# Patient Record
Sex: Female | Born: 1935 | Hispanic: No | Marital: Single | Smoking: Never smoker
Health system: Southern US, Community
[De-identification: ages and names within clinical notes are randomized; demographics above are authoritative.]

## PROBLEM LIST (undated history)

## (undated) DIAGNOSIS — J45909 Unspecified asthma, uncomplicated: Secondary | ICD-10-CM

## (undated) DIAGNOSIS — I10 Essential (primary) hypertension: Secondary | ICD-10-CM

## (undated) DIAGNOSIS — G43909 Migraine, unspecified, not intractable, without status migrainosus: Secondary | ICD-10-CM

## (undated) DIAGNOSIS — E119 Type 2 diabetes mellitus without complications: Secondary | ICD-10-CM

---

## 2014-03-05 ENCOUNTER — Emergency Department (HOSPITAL_COMMUNITY)
Admission: EM | Admit: 2014-03-05 | Discharge: 2014-03-06 | Disposition: A | Discharge status: Home / Self Care | Payer: Self-pay | Attending: Emergency Medicine | Admitting: Emergency Medicine

## 2014-03-05 ENCOUNTER — Encounter (HOSPITAL_COMMUNITY): Payer: Self-pay | Admitting: Emergency Medicine

## 2014-03-05 DIAGNOSIS — G43909 Migraine, unspecified, not intractable, without status migrainosus: Secondary | ICD-10-CM | POA: Insufficient documentation

## 2014-03-05 DIAGNOSIS — J45909 Unspecified asthma, uncomplicated: Secondary | ICD-10-CM | POA: Insufficient documentation

## 2014-03-05 DIAGNOSIS — I10 Essential (primary) hypertension: Secondary | ICD-10-CM | POA: Insufficient documentation

## 2014-03-05 DIAGNOSIS — E119 Type 2 diabetes mellitus without complications: Secondary | ICD-10-CM | POA: Insufficient documentation

## 2014-03-05 HISTORY — DX: Type 2 diabetes mellitus without complications: E11.9

## 2014-03-05 HISTORY — DX: Migraine, unspecified, not intractable, without status migrainosus: G43.909

## 2014-03-05 HISTORY — DX: Unspecified asthma, uncomplicated: J45.909

## 2014-03-05 HISTORY — DX: Essential (primary) hypertension: I10

## 2014-03-05 LAB — I-STAT TROPONIN, ED: Troponin i, poc: 0.01 ng/mL (ref 0.00–0.08)

## 2014-03-05 LAB — I-STAT CHEM 8, ED
BUN: 17 mg/dL (ref 6–23)
Calcium, Ion: 1.24 mmol/L (ref 1.13–1.30)
Chloride: 102 meq/L (ref 96–112)
Creatinine, Ser: 0.9 mg/dL (ref 0.50–1.10)
Glucose, Bld: 174 mg/dL — ABNORMAL HIGH (ref 70–99)
HCT: 40 % (ref 36.0–46.0)
Hemoglobin: 13.6 g/dL (ref 12.0–15.0)
Potassium: 4.1 meq/L (ref 3.7–5.3)
Sodium: 139 meq/L (ref 137–147)
TCO2: 27 mmol/L (ref 0–100)

## 2014-03-05 MED ORDER — HYDROMORPHONE HCL PF 1 MG/ML IJ SOLN
0.5000 mg | Freq: Once | INTRAMUSCULAR | Status: AC
  Administered 2014-03-05: 0.5 mg via INTRAVENOUS
  Filled 2014-03-05: qty 1

## 2014-03-05 MED ORDER — METOCLOPRAMIDE HCL 5 MG/ML IJ SOLN
5.0000 mg | Freq: Once | INTRAMUSCULAR | Status: AC
  Administered 2014-03-05: 5 mg via INTRAVENOUS
  Filled 2014-03-05: qty 2

## 2014-03-05 NOTE — ED Notes (Signed)
Pt is c/o migraine headache  Pt states she has pain in her head every night and has for years but tonight the pain is more severe than normal and it is radiating down the left side into her arm and ribs  Pt states she took her medication for her migraines but it is not working

## 2014-03-05 NOTE — ED Notes (Signed)
Pt is alert and oriented, though it is hard to obtain a direct answer is to why tonight is different this pain has been going on for two years,  The reason I understand tonight is different is because she was crying and her usual dose of migraine medication isn't helping.

## 2014-03-05 NOTE — ED Provider Notes (Addendum)
CSN: [id number]     Arrival date & time 03/05/14  2017 History   First MD Initiated Contact with Patient 03/05/14 2113     Chief Complaint  Patient presents with  . Migraine     (Consider location/radiation/quality/duration/timing/severity/associated sxs/prior Treatment) HPI Complains of left-sidedt frontal temporal parietal headache radiating to her left posterior ribs and left arm typical of her migraines that she's had in the past first several years. Symptoms started yesterday. No treatment prior to coming here nothing makes symptoms better or worse she gets similar headaches several times per month for many years. No other associated symptoms. No associated shortness of breath nausea or sweating. Past Medical History  Diagnosis Date  . Migraine headache   . Diabetes mellitus without complication   . Hypertension   . Asthma    History reviewed. No pertinent past surgical history. Family History  Problem Relation Age of Onset  . Diabetes Other    History  Substance Use Topics  . Smoking status: Never Smoker   . Smokeless tobacco: Not on file  . Alcohol Use: No   [place]   Grav Para Term Preterm Abortions TAB SAB Ect Mult Living                 Review of Systems  Constitutional: Negative.   Respiratory: Negative.   Cardiovascular: Negative.   Gastrointestinal: Negative.   Musculoskeletal: Negative.   Skin: Negative.   Neurological: Positive for headaches.  Psychiatric/Behavioral: Negative.   All other systems reviewed and are negative.     Allergies  Review of patient's allergies indicates no known allergies.  Home Medications   Prior to Admission medications   Medication Sig Start Date End Date Taking? Authorizing Deshanti Adcox  aspirin-acetaminophen-caffeine (EXCEDRIN MIGRAINE) [phone] MG per tablet Take 1-2 tablets by mouth every 6 (six) hours as needed for headache.   Yes Historical Jarrin Staley, MD  PRESCRIPTION MEDICATION Take 1  tablet by mouth 2 (two) times daily.   Yes Historical Mariza Bourget, MD  PRESCRIPTION MEDICATION Take 1 tablet by mouth daily.   Yes Historical Gracelynne Benedict, MD   BP 201/91  Pulse 86  Temp(Src) 98 F (36.7 C) (Oral)  Resp 16  Wt 148 lb (67.132 kg)  SpO2 98% Physical Exam  Nursing note and vitals reviewed. Constitutional: She is oriented to person, place, and time. She appears well-developed and well-nourished.  HENT:  Head: Normocephalic and atraumatic.  Eyes: Conjunctivae are normal. Pupils are equal, round, and reactive to light.  Neck: Neck supple. No tracheal deviation present. No thyromegaly present.  Cardiovascular: Normal rate and regular rhythm.   No murmur heard. Pulmonary/Chest: Effort normal and breath sounds normal.  Abdominal: Soft. Bowel sounds are normal. She exhibits no distension. There is no tenderness.  Musculoskeletal: Normal range of motion. She exhibits no edema and no tenderness.  Neurological: She is alert and oriented to person, place, and time. She has normal reflexes. Coordination normal.  Gait normal Romberg normal prior drift normal cranial nerves II through XII grossly  Skin: Skin is warm and dry. No rash noted.  Psychiatric: She has a normal mood and affect.    ED Course  Procedures (including critical care time) Labs Review Labs Reviewed - No data to display  Imaging Review No results found.   EKG Interpretation   Date/Time:  Sunday March 05 2014 20:49:57 EDT Ventricular Rate:  84 PR Interval:  152 QRS Duration: 84 QT Interval:  422 QTC Calculation: 499 R Axis:  38 Text Interpretation:  Sinus rhythm Borderline repolarization abnormality  Borderline prolonged QT interval No old tracing to compare Confirmed by  JACUBOWITZ  MD, SAM [phone]) on 03/05/2014 9:40:32 PM     2310 p.m. pain at right rib resolved after treatment with intravenous Reglan. She's requesting more medicine for headache however. Hydromorphone ordered. 2 3:50 PM patient  asymptomatic, pain free. Patient to return to home. Results for orders placed during the hospital encounter of 03/05/14  I-STAT TROPOININ, ED      Result Value Ref Range   Troponin i, poc 0.01  0.00 - 0.08 ng/mL   Comment 3           I-STAT CHEM 8, ED      Result Value Ref Range   Sodium 139  137 - 147 mEq/L   Potassium 4.1  3.7 - 5.3 mEq/L   Chloride 102  96 - 112 mEq/L   BUN 17  6 - 23 mg/dL   Creatinine, Ser 1.91  0.50 - 1.10 mg/dL   Glucose, Bld 478 (*) 70 - 99 mg/dL   Calcium, Ion 2.95  6.21 - 1.30 mmol/L   TCO2 27  0 - 100 mmol/L   Hemoglobin 13.6  12.0 - 15.0 g/dL   HCT 30.8  65.7 - 84.6 %   No results found. Results for orders placed during the hospital encounter of 03/05/14  I-STAT TROPOININ, ED      Result Value Ref Range   Troponin i, poc 0.01  0.00 - 0.08 ng/mL   Comment 3           I-STAT CHEM 8, ED      Result Value Ref Range   Sodium 139  137 - 147 mEq/L   Potassium 4.1  3.7 - 5.3 mEq/L   Chloride 102  96 - 112 mEq/L   BUN 17  6 - 23 mg/dL   Creatinine, Ser 9.62  0.50 - 1.10 mg/dL   Glucose, Bld 952 (*) 70 - 99 mg/dL   Calcium, Ion 8.41  3.24 - 1.30 mmol/L   TCO2 27  0 - 100 mmol/L   Hemoglobin 13.6  12.0 - 15.0 g/dL   HCT 40.1  02.7 - 25.3 %   Ct Head Wo Contrast  03/06/2014   CLINICAL DATA:  Left-sided headache.  EXAM: CT HEAD WITHOUT CONTRAST  TECHNIQUE: Contiguous axial images were obtained from the base of the skull through the vertex without intravenous contrast.  COMPARISON:  None.  FINDINGS: Prominence of the sulci and ventricles noted compatible with brain atrophy. No acute cortical infarct, hemorrhage, or mass lesion ispresent. No significant extra-axial fluid collection is present. The paranasal sinuses andmastoid air cells are clear. The osseous skull is intact.  IMPRESSION: 1. Brain atrophy. 2. No acute intracranial abnormalities.   Electronically Signed   By: Signa Kell M.D.   On: 03/06/2014 00:51     MDM  Symptoms for long-standing and  chronic for several years. I don't feel the patient suffering from hypertensive emergency borderline feel that she's suffering from acute coronary syndrome. She gets rib pain and these identical symptoms with each of her migraines. She reports she had a CT scan of the brain approximately 6 years ago which was normal. Acute imaging not indicated. Family and pt insistent on CT scan, therefore ordered by me Final diagnoses:  None   plan blood pressure recheck 1 week. Take all medications as prescribed Diagnosis #1 migraine headache #2 hyperglycemia #3 hypertension  Doug SouSam Jacubowitz, MD 03/06/14 0001  Doug SouSam Jacubowitz, MD 03/06/14 [id number]  Doug SouSam Jacubowitz, MD 03/06/14 [phone]

## 2014-03-06 ENCOUNTER — Emergency Department (HOSPITAL_COMMUNITY): Payer: Self-pay

## 2014-03-06 NOTE — Discharge Instructions (Signed)
Migraine Headache Take your medication as prescribed. Get your blood pressure rechecked within a week. You can go to an urgent care Center to get your blood pressure recheck. Return if your condition worsens for any reason .Today's blood sugar was mildly elevated at 174  A migraine headache is an intense, throbbing pain on one or both sides of your head. A migraine can last for 30 minutes to several hours. CAUSES  The exact cause of a migraine headache is not always known. However, a migraine may be caused when nerves in the brain become irritated and release chemicals that cause inflammation. This causes pain. Certain things may also trigger migraines, such as:  Alcohol.  Smoking.  Stress.  Menstruation.  Aged cheeses.  Foods or drinks that contain nitrates, glutamate, aspartame, or tyramine.  Lack of sleep.  Chocolate.  Caffeine.  Hunger.  Physical exertion.  Fatigue.  Medicines used to treat chest pain (nitroglycerine), birth control pills, estrogen, and some blood pressure medicines. SIGNS AND SYMPTOMS  Pain on one or both sides of your head.  Pulsating or throbbing pain.  Severe pain that prevents daily activities.  Pain that is aggravated by any physical activity.  Nausea, vomiting, or both.  Dizziness.  Pain with exposure to bright lights, loud noises, or activity.  General sensitivity to bright lights, loud noises, or smells. Before you get a migraine, you may get warning signs that a migraine is coming (aura). An aura may include:  Seeing flashing lights.  Seeing bright spots, halos, or zig-zag lines.  Having tunnel vision or blurred vision.  Having feelings of numbness or tingling.  Having trouble talking.  Having muscle weakness. DIAGNOSIS  A migraine headache is often diagnosed based on:  Symptoms.  Physical exam.  A CT scan or MRI of your head. These imaging tests cannot diagnose migraines, but they can help rule out other causes of  headaches. TREATMENT Medicines may be given for pain and nausea. Medicines can also be given to help prevent recurrent migraines.  HOME CARE INSTRUCTIONS  Only take over-the-counter or prescription medicines for pain or discomfort as directed by your health care provider. The use of long-term narcotics is not recommended.  Lie down in a dark, quiet room when you have a migraine.  Keep a journal to find out what may trigger your migraine headaches. For example, write down:  What you eat and drink.  How much sleep you get.  Any change to your diet or medicines.  Limit alcohol consumption.  Quit smoking if you smoke.  Get 7-9 hours of sleep, or as recommended by your health care provider.  Limit stress.  Keep lights dim if bright lights bother you and make your migraines worse. SEEK IMMEDIATE MEDICAL CARE IF:   Your migraine becomes severe.  You have a fever.  You have a stiff neck.  You have vision loss.  You have muscular weakness or loss of muscle control.  You start losing your balance or have trouble walking.  You feel faint or pass out.  You have severe symptoms that are different from your first symptoms. MAKE SURE YOU:   Understand these instructions.  Will watch your condition.  Will get help right away if you are not doing well or get worse. Document Released: 08/25/2005 Document Revised: 06/15/2013 Document Reviewed: 05/02/2013 [facility] Patient Information 2015 [employer] [place]. This information is not intended to replace advice given to you by your health care provider. Make sure you discuss any questions you have with  your health care provider. ° °

## 2020-02-17 ENCOUNTER — Emergency Department (HOSPITAL_COMMUNITY): Payer: Self-pay

## 2020-02-17 ENCOUNTER — Emergency Department (HOSPITAL_COMMUNITY)
Admission: EM | Admit: 2020-02-17 | Discharge: 2020-02-18 | Disposition: A | Discharge status: Home / Self Care | Payer: Self-pay | Attending: Emergency Medicine | Admitting: Emergency Medicine

## 2020-02-17 ENCOUNTER — Other Ambulatory Visit: Payer: Self-pay

## 2020-02-17 ENCOUNTER — Encounter (HOSPITAL_COMMUNITY): Payer: Self-pay

## 2020-02-17 DIAGNOSIS — L989 Disorder of the skin and subcutaneous tissue, unspecified: Secondary | ICD-10-CM

## 2020-02-17 DIAGNOSIS — R52 Pain, unspecified: Secondary | ICD-10-CM

## 2020-02-17 DIAGNOSIS — L988 Other specified disorders of the skin and subcutaneous tissue: Secondary | ICD-10-CM | POA: Insufficient documentation

## 2020-02-17 DIAGNOSIS — I10 Essential (primary) hypertension: Secondary | ICD-10-CM | POA: Insufficient documentation

## 2020-02-17 DIAGNOSIS — J45909 Unspecified asthma, uncomplicated: Secondary | ICD-10-CM | POA: Insufficient documentation

## 2020-02-17 DIAGNOSIS — E119 Type 2 diabetes mellitus without complications: Secondary | ICD-10-CM | POA: Insufficient documentation

## 2020-02-17 DIAGNOSIS — Z7982 Long term (current) use of aspirin: Secondary | ICD-10-CM | POA: Insufficient documentation

## 2020-02-17 MED ORDER — AMLODIPINE BESYLATE 5 MG PO TABS
5.0000 mg | ORAL_TABLET | Freq: Once | ORAL | Status: AC
  Administered 2020-02-18: 5 mg via ORAL
  Filled 2020-02-17: qty 1

## 2020-02-17 MED ORDER — DOXYCYCLINE HYCLATE 100 MG PO TABS
100.0000 mg | ORAL_TABLET | Freq: Once | ORAL | Status: AC
  Administered 2020-02-17: 100 mg via ORAL
  Filled 2020-02-17: qty 1

## 2020-02-17 MED ORDER — HYDROCHLOROTHIAZIDE 12.5 MG PO CAPS
12.5000 mg | ORAL_CAPSULE | Freq: Once | ORAL | Status: AC
  Administered 2020-02-18: 12.5 mg via ORAL
  Filled 2020-02-17: qty 1

## 2020-02-17 MED ORDER — DOXYCYCLINE HYCLATE 100 MG PO CAPS: 100.0000 mg | ORAL_CAPSULE | Freq: Two times a day (BID) | ORAL | 0 refills | Status: AC

## 2020-02-17 MED ORDER — BACITRACIN ZINC 500 UNIT/GM EX OINT
TOPICAL_OINTMENT | Freq: Two times a day (BID) | CUTANEOUS | Status: DC
  Filled 2020-02-17: qty 1.8

## 2020-02-17 MED ORDER — MUPIROCIN CALCIUM 2 % EX CREA
1.0000 | TOPICAL_CREAM | Freq: Two times a day (BID) | CUTANEOUS | 0 refills | Status: AC
Start: 2020-02-17 — End: ?

## 2020-02-17 NOTE — ED Triage Notes (Signed)
Pt reports that she hit the back of her L foot about 3 weeks ago. States that it started as a bruise and now the skin has cracked and it looks like more like an ulcer. No bleeding.

## 2020-02-17 NOTE — ED Provider Notes (Addendum)
[facility] DEPT Provider Note   CSN: [id number] Arrival date & time: 02/17/20  2208     History Chief Complaint  Patient presents with  . Foot Pain    Catherine Peck is a 86 y.o. female.  The history is provided by the patient.  Foot Pain This is a new problem. The current episode started more than 1 week ago. The problem occurs constantly. The problem has not changed since onset.Pertinent negatives include no chest pain, no abdominal pain, no headaches and no shortness of breath. Nothing aggravates the symptoms. Nothing relieves the symptoms. She has tried nothing for the symptoms. The treatment provided no relief.  Hit left heel 3 weeks ago and now has cracking of the skin so son brought her here.  He has not done anything for the wound.  No f/c/r.      Past Medical History:  Diagnosis Date  . Asthma   . Diabetes mellitus without complication ([facility])   . Hypertension   . Migraine headache     There are no problems to display for this patient.   History reviewed. No pertinent surgical history.   OB History   No obstetric history on file.     Family History  Problem Relation Age of Onset  . Diabetes Other     Social History   Tobacco Use  . Smoking status: Never Smoker  Substance Use Topics  . Alcohol use: No  . Drug use: No    Home Medications Prior to Admission medications   Medication Sig Start Date End Date Taking? Authorizing Provider  aspirin-acetaminophen-caffeine (EXCEDRIN MIGRAINE) [phone] MG per tablet Take 1-2 tablets by mouth every 6 (six) hours as needed for headache.    [provider]  PRESCRIPTION MEDICATION Take 1 tablet by mouth 2 (two) times daily.    [provider]  PRESCRIPTION MEDICATION Take 1 tablet by mouth daily.    [provider]    Allergies    Patient has no known allergies.  Review of Systems   Review of Systems  Constitutional: Negative for fever.  HENT:  Negative for congestion.   Eyes: Negative for visual disturbance.  Respiratory: Negative for shortness of breath.   Cardiovascular: Negative for chest pain.  Gastrointestinal: Negative for abdominal pain.  Genitourinary: Negative for difficulty urinating.  Musculoskeletal: Negative for arthralgias.  Skin: Positive for wound.  Neurological: Negative for headaches.  Psychiatric/Behavioral: Negative for agitation.  All other systems reviewed and are negative.   Physical Exam Updated Vital Signs BP (!) 154/100 (BP Location: Right Arm)   Pulse 87   Temp 98 F (36.7 C) (Oral)   Resp 18   SpO2 99%   Physical Exam Vitals and nursing note reviewed.  Constitutional:      General: She is not in acute distress.    Appearance: Normal appearance.  HENT:     Head: Normocephalic and atraumatic.     Nose: Nose normal.  Eyes:     Conjunctiva/sclera: Conjunctivae normal.     Pupils: Pupils are equal, round, and reactive to light.  Cardiovascular:     Rate and Rhythm: Normal rate and regular rhythm.     Pulses: Normal pulses.     Heart sounds: Normal heart sounds.  Pulmonary:     Effort: Pulmonary effort is normal.     Breath sounds: Normal breath sounds.  Abdominal:     General: Abdomen is flat. Bowel sounds are normal.     Tenderness: There is  no abdominal tenderness. There is no guarding or rebound.  Musculoskeletal:     Cervical back: Normal range of motion and neck supple.  Skin:    General: Skin is warm and dry.     Capillary Refill: Capillary refill takes less than 2 seconds.       Neurological:     General: No focal deficit present.     Mental Status: She is alert and oriented to person, place, and time.     Deep Tendon Reflexes: Reflexes normal.  Psychiatric:        Mood and Affect: Mood normal.        Behavior: Behavior normal.     ED Results / Procedures / Treatments   Labs (all labs ordered are listed, but only abnormal results are displayed) Results for orders  placed or performed during the hospital encounter of 02/17/20  CBC with Differential/Platelet  Result Value Ref Range   WBC 7.9 4.0 - 10.5 K/uL   RBC 5.90 (H) 3.87 - 5.11 MIL/uL   Hemoglobin 11.9 (L) 12.0 - 15.0 g/dL   HCT 01.4 36 - 46 %   MCV 65.6 (L) 80.0 - 100.0 fL   MCH 20.2 (L) 26.0 - 34.0 pg   MCHC 30.7 30.0 - 36.0 g/dL   RDW 10.3 (H) 01.3 - 14.3 %   Platelets 272 150 - 400 K/uL   nRBC 0.0 0.0 - 0.2 %   Neutrophils Relative % 56 %   Neutro Abs 4.4 1.7 - 7.7 K/uL   Lymphocytes Relative 36 %   Lymphs Abs 2.9 0.7 - 4.0 K/uL   Monocytes Relative 8 %   Monocytes Absolute 0.6 0 - 1 K/uL   Eosinophils Relative 0 %   Eosinophils Absolute 0.0 0 - 0 K/uL   Basophils Relative 0 %   Basophils Absolute 0.0 0 - 0 K/uL   Immature Granulocytes 0 %   Abs Immature Granulocytes 0.02 0.00 - 0.07 K/uL  Basic metabolic panel  Result Value Ref Range   Sodium 139 135 - 145 mmol/L   Potassium 4.6 3.5 - 5.1 mmol/L   Chloride 102 98 - 111 mmol/L   CO2 24 22 - 32 mmol/L   Glucose, Bld 108 (H) 70 - 99 mg/dL   BUN 20 8 - 23 mg/dL   Creatinine, Ser 8.88 0.44 - 1.00 mg/dL   Calcium 9.5 8.9 - 75.7 mg/dL   GFR calc non Af Amer 53 (L) >60 mL/min   GFR calc Af Amer >60 >60 mL/min   Anion gap 13 5 - 15  Troponin I (High Sensitivity)  Result Value Ref Range   Troponin I (High Sensitivity) 5 <18 ng/L  Troponin I (High Sensitivity)  Result Value Ref Range   Troponin I (High Sensitivity) 4 <18 ng/L   DG Chest Portable 1 View  Result Date: 02/18/2020 CLINICAL DATA:  Hypertension EXAM: PORTABLE CHEST 1 VIEW COMPARISON:  None. FINDINGS: Aortic atherosclerosis. Heart is borderline in size. Lungs clear. No effusions or edema. No acute bony abnormality. IMPRESSION: No active disease. Aortic atherosclerosis. Electronically Signed   By: Charlett Nose M.D.   On: 02/18/2020 00:09   DG Foot Complete Left  Result Date: 02/17/2020 CLINICAL DATA:  Left foot pain. Patient reports she struck back of foot 3 weeks ago.  Started as a bruise and now has cracked and looks more like an ulcer. EXAM: LEFT FOOT - COMPLETE 3+ VIEW COMPARISON:  None. FINDINGS: Slight splaying of the great and second toes, of  unknown chronicity. No evidence of acute or healing fracture. Mild mid and hindfoot degenerative change. No evidence of bony destruction or periosteal reaction. Small plantar calcaneal spur and Achilles tendon enthesophyte. Skin irregularity posterior to the calcaneus without tracking soft tissue air or radiopaque foreign body. IMPRESSION: 1. Skin irregularity posterior to the calcaneus without tracking soft tissue air or radiopaque foreign body. 2. Slight splaying of the great and second toes, of unknown chronicity, may be chronic/degenerative. 3. No acute osseous abnormalities or radiographic findings of osteomyelitis. Electronically Signed   By: Narda Rutherford M.D.   On: 02/17/2020 23:30    EKG  EKG Interpretation  Date/Time:  Friday February 17 2020 23:50:02 EDT Ventricular Rate:  84 PR Interval:    QRS Duration: 79 QT Interval:  317 QTC Calculation: 375 R Axis:   24 Text Interpretation: Sinus rhythm LAE, consider biatrial enlargement Repol abnormality seen in 2015 Confirmed by Kaulana Brindle ([id number]) on 02/17/2020 11:59:41 PM       Radiology No results found.  Procedures Procedures (including critical care time)  Medications Ordered in ED Medications  doxycycline (VIBRA-TABS) tablet 100 mg (has no administration in time range)  bacitracin ointment (has no administration in time range)    ED Course  I have reviewed the triage vital signs and the nursing notes.  Pertinent labs & imaging results that were available during my care of the patient were reviewed by me and considered in my medical decision making (see chart for details).    Will refer to Dr. Mikey Bussing, wound care, for ongoing care.  No signs of infection at this time.  As patient is diabetic will start antibiotics and mupirocin.  No bony  pathology.   Attempted discharge of the patient and informed patient began having LU chest pain.  Discharge stopped and ekg and labwork performed immediately.  Pain relieved with Tylenol.  2 sets of troponins resulted and normal.  EKG is unchanged will refer to cardiology for BP management and outpatient stress test.    Catherine Peck was evaluated in Emergency Department on 02/18/2020 for the symptoms described in the history of present illness. She was evaluated in the context of the global COVID-19 pandemic, which necessitated consideration that the patient might be at risk for infection with the SARS-CoV-2 virus that causes COVID-19. Institutional protocols and algorithms that pertain to the evaluation of patients at risk for COVID-19 are in a state of rapid change based on information released by regulatory bodies including the CDC and federal and state organizations. These policies and algorithms were followed during the patient's care in the ED.   Final Clinical Impression(s) / ED Diagnoses Return for intractable cough, coughing up blood,fevers >100.4 unrelieved by medication, shortness of breath, intractable vomiting, chest pain, shortness of breath, weakness,numbness, changes in speech, facial asymmetry,abdominal pain, passing out,Inability to tolerate liquids or food, cough, altered mental status or any concerns. No signs of systemic illness or infection. The patient is nontoxic-appearing on exam and vital signs are within normal limits.   I have reviewed the triage vital signs and the nursing notes. Pertinent labs &imaging results that were available during my care of the patient were reviewed by me and considered in my medical decision making (see chart for details).After history, exam, and medical workup I feel the patient has beenappropriately medically screened and is safe for discharge home. Pertinent diagnoses were discussed with the patient. Patient was given return  precautions.    Charley Lafrance, MD 02/17/20 2326    Nicanor Alcon,  Danissa Rundle, MD 02/18/20 3235

## 2020-02-17 NOTE — ED Notes (Signed)
Pt transported to radiology.

## 2020-02-18 LAB — CBC WITH DIFFERENTIAL/PLATELET
Abs Immature Granulocytes: 0.02 10*3/uL (ref 0.00–0.07)
Basophils Absolute: 0 10*3/uL (ref 0.0–0.1)
Basophils Relative: 0 %
Eosinophils Absolute: 0 10*3/uL (ref 0.0–0.5)
Eosinophils Relative: 0 %
HCT: 38.7 % (ref 36.0–46.0)
Hemoglobin: 11.9 g/dL — ABNORMAL LOW (ref 12.0–15.0)
Immature Granulocytes: 0 %
Lymphocytes Relative: 36 %
Lymphs Abs: 2.9 10*3/uL (ref 0.7–4.0)
MCH: 20.2 pg — ABNORMAL LOW (ref 26.0–34.0)
MCHC: 30.7 g/dL (ref 30.0–36.0)
MCV: 65.6 fL — ABNORMAL LOW (ref 80.0–100.0)
Monocytes Absolute: 0.6 10*3/uL (ref 0.1–1.0)
Monocytes Relative: 8 %
Neutro Abs: 4.4 10*3/uL (ref 1.7–7.7)
Neutrophils Relative %: 56 %
Platelets: 272 10*3/uL (ref 150–400)
RBC: 5.9 MIL/uL — ABNORMAL HIGH (ref 3.87–5.11)
RDW: 17.3 % — ABNORMAL HIGH (ref 11.5–15.5)
WBC: 7.9 10*3/uL (ref 4.0–10.5)
nRBC: 0 % (ref 0.0–0.2)

## 2020-02-18 LAB — BASIC METABOLIC PANEL
Anion gap: 13 (ref 5–15)
BUN: 20 mg/dL (ref 8–23)
CO2: 24 mmol/L (ref 22–32)
Calcium: 9.5 mg/dL (ref 8.9–10.3)
Chloride: 102 mmol/L (ref 98–111)
Creatinine, Ser: 0.97 mg/dL (ref 0.44–1.00)
GFR calc Af Amer: >60 mL/min (ref >60)
GFR calc non Af Amer: 53 mL/min — ABNORMAL LOW (ref >60)
Glucose, Bld: 108 mg/dL — ABNORMAL HIGH (ref 70–99)
Potassium: 4.6 mmol/L (ref 3.5–5.1)
Sodium: 139 mmol/L (ref 135–145)

## 2020-02-18 LAB — TROPONIN I (HIGH SENSITIVITY)
Troponin I (High Sensitivity): 4 ng/L (ref <18)
Troponin I (High Sensitivity): 5 ng/L (ref <18)

## 2020-02-18 MED ORDER — HYDROCHLOROTHIAZIDE 25 MG PO TABS: 25.0000 mg | ORAL_TABLET | Freq: Every day | ORAL | 0 refills | Status: AC

## 2020-02-18 MED ORDER — ACETAMINOPHEN 500 MG PO TABS
1000.0000 mg | ORAL_TABLET | Freq: Once | ORAL | Status: AC
  Administered 2020-02-18: 1000 mg via ORAL
  Filled 2020-02-18: qty 2

## 2020-02-18 MED ORDER — ALUM & MAG HYDROXIDE-SIMETH 200-200-20 MG/5ML PO SUSP
30.0000 mL | Freq: Once | ORAL | Status: AC
  Administered 2020-02-18: 30 mL via ORAL
  Filled 2020-02-18: qty 30

## 2020-02-18 NOTE — ED Notes (Signed)
Pts wound on left foot cleaned and dressed.

## 2020-02-18 NOTE — ED Notes (Signed)
Pt complaining of headache and left side chest pain. Pts blood pressure currently reading 215/110. Provider made aware.

## 2020-02-18 NOTE — ED Notes (Signed)
Pt wheeled out of department. Verbalized understanding discharge instructions, prescriptions, and follow-up. In no acute distress. Additional dressing supplies provided.

## 2020-03-23 ENCOUNTER — Ambulatory Visit: Payer: Self-pay | Admitting: Family Medicine

## 2020-04-06 ENCOUNTER — Ambulatory Visit: Payer: Self-pay | Admitting: Family Medicine

## 2020-04-27 ENCOUNTER — Ambulatory Visit: Payer: Self-pay | Admitting: Family Medicine

## 2020-05-10 ENCOUNTER — Telehealth: Payer: Self-pay | Admitting: Family Medicine

## 2020-05-10 ENCOUNTER — Encounter: Payer: Self-pay | Admitting: Family Medicine

## 2020-05-10 NOTE — Telephone Encounter (Signed)
Pt was no show for appt 04/27/2020. 1 no show + 2 cancellations (Dr. Doreene Burke and Dr. Barron Alvine) Letter mailed that we cannot reschedule with Dr. Doreene Burke or Dr. Barron Alvine

## 2021-06-28 IMAGING — CR DG FOOT COMPLETE 3+V*L*
3 series · 3 of 3 positions shown · non-contrast
Comparison: None.

CLINICAL DATA: Left foot pain. Patient reports she struck back of
foot 3 weeks ago. Started as a bruise and now has cracked and looks
more like an ulcer.

EXAM:
LEFT FOOT - COMPLETE 3+ VIEW

[x foot ap left]
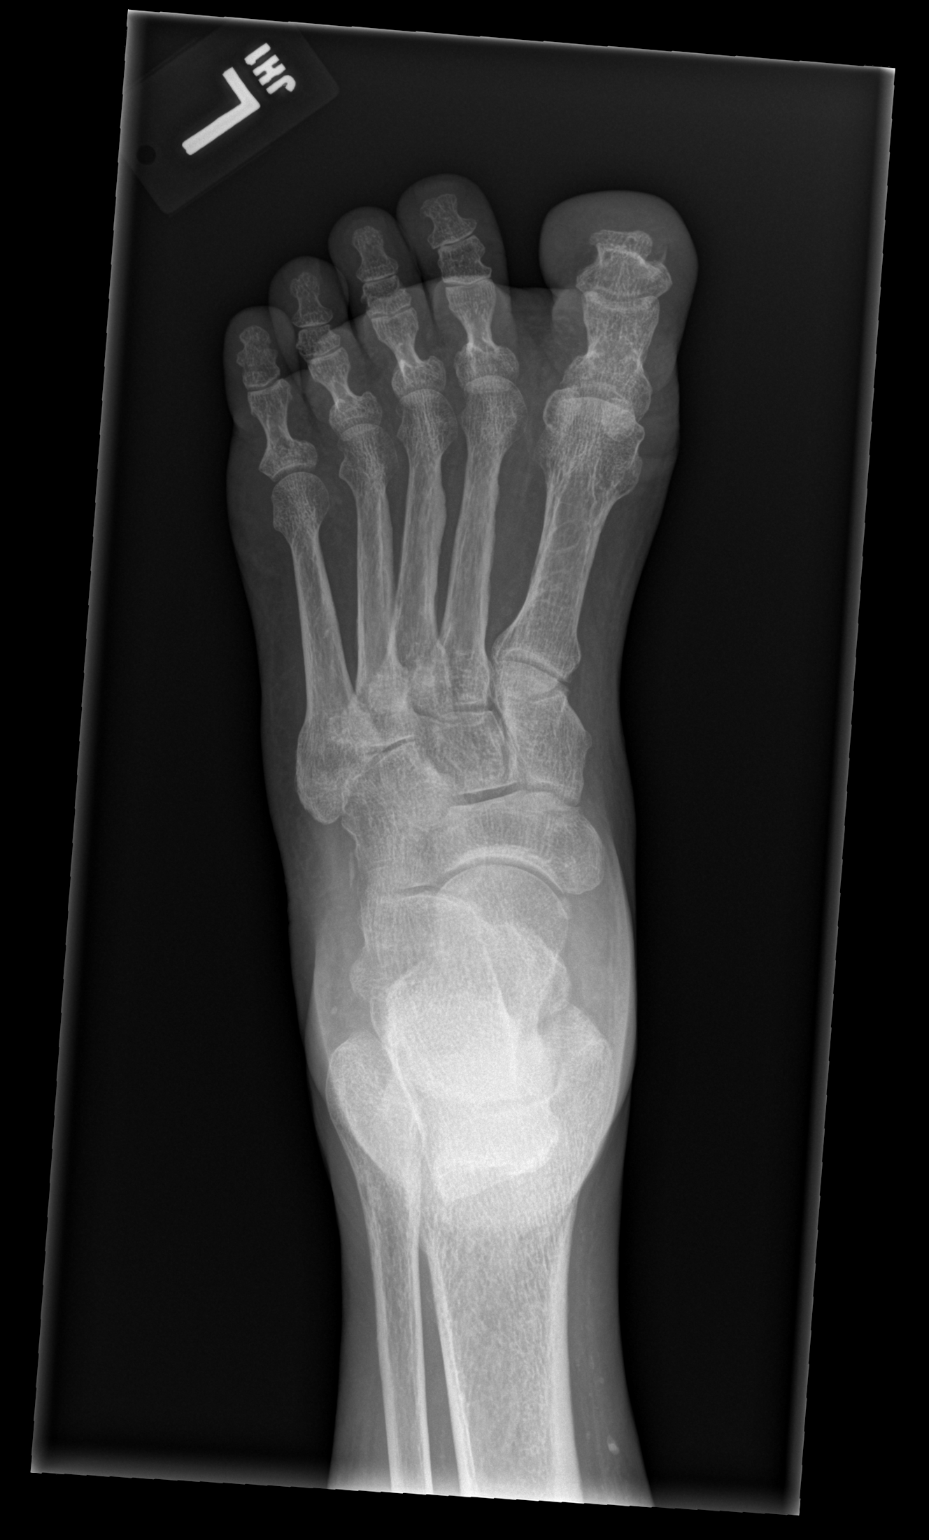

[x foot obl left]
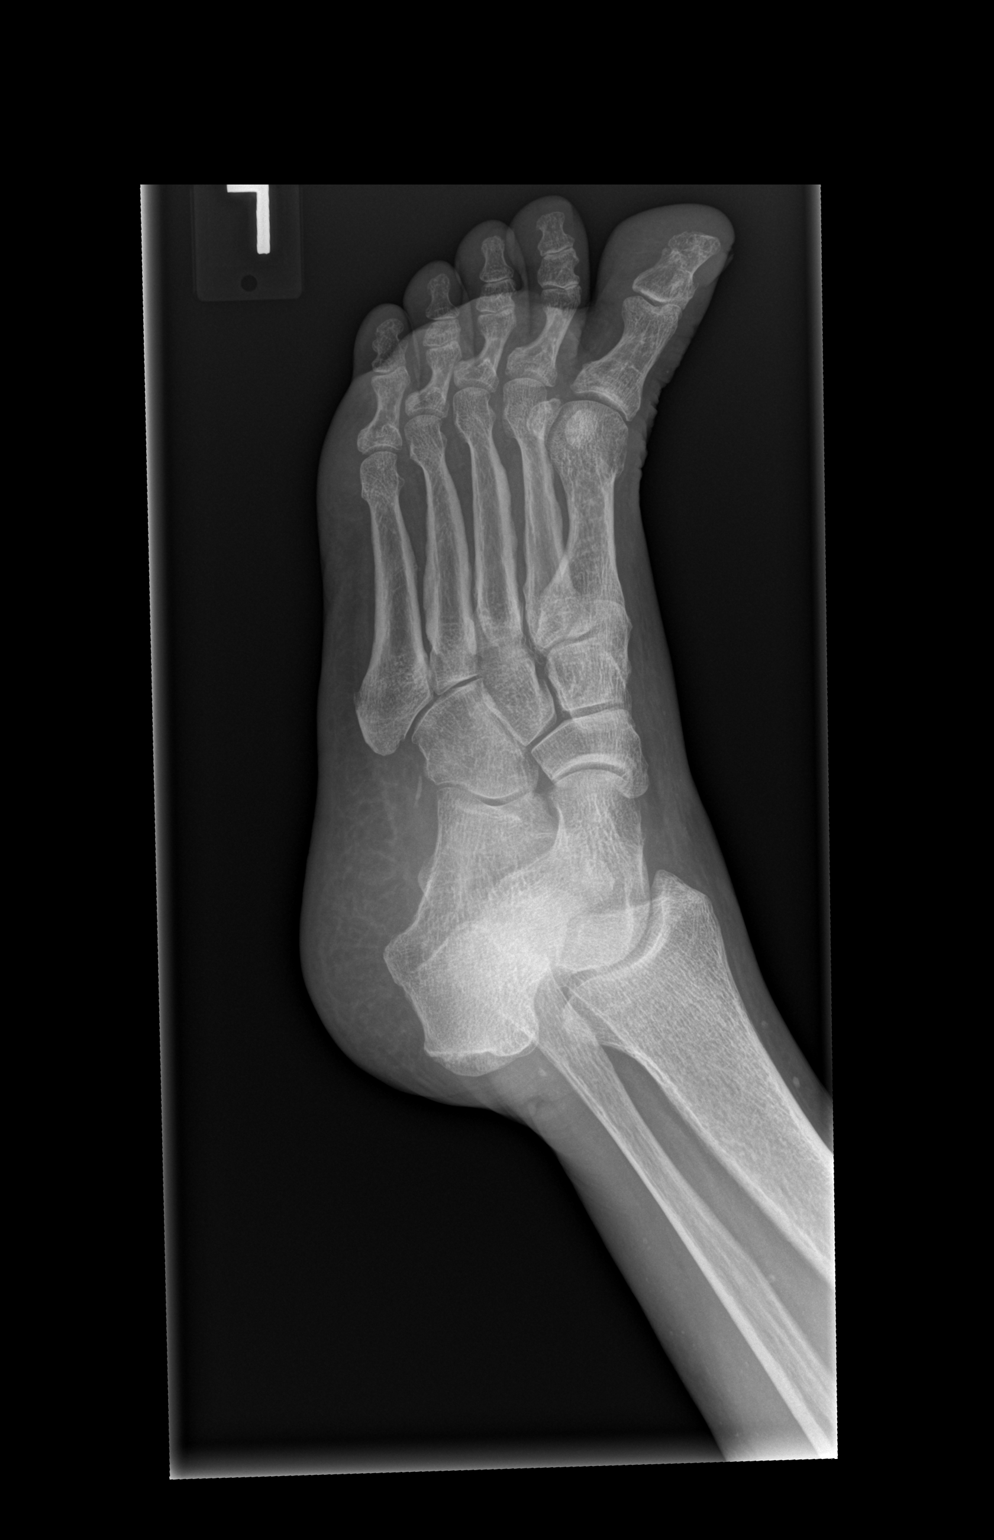

[x foot lat left]
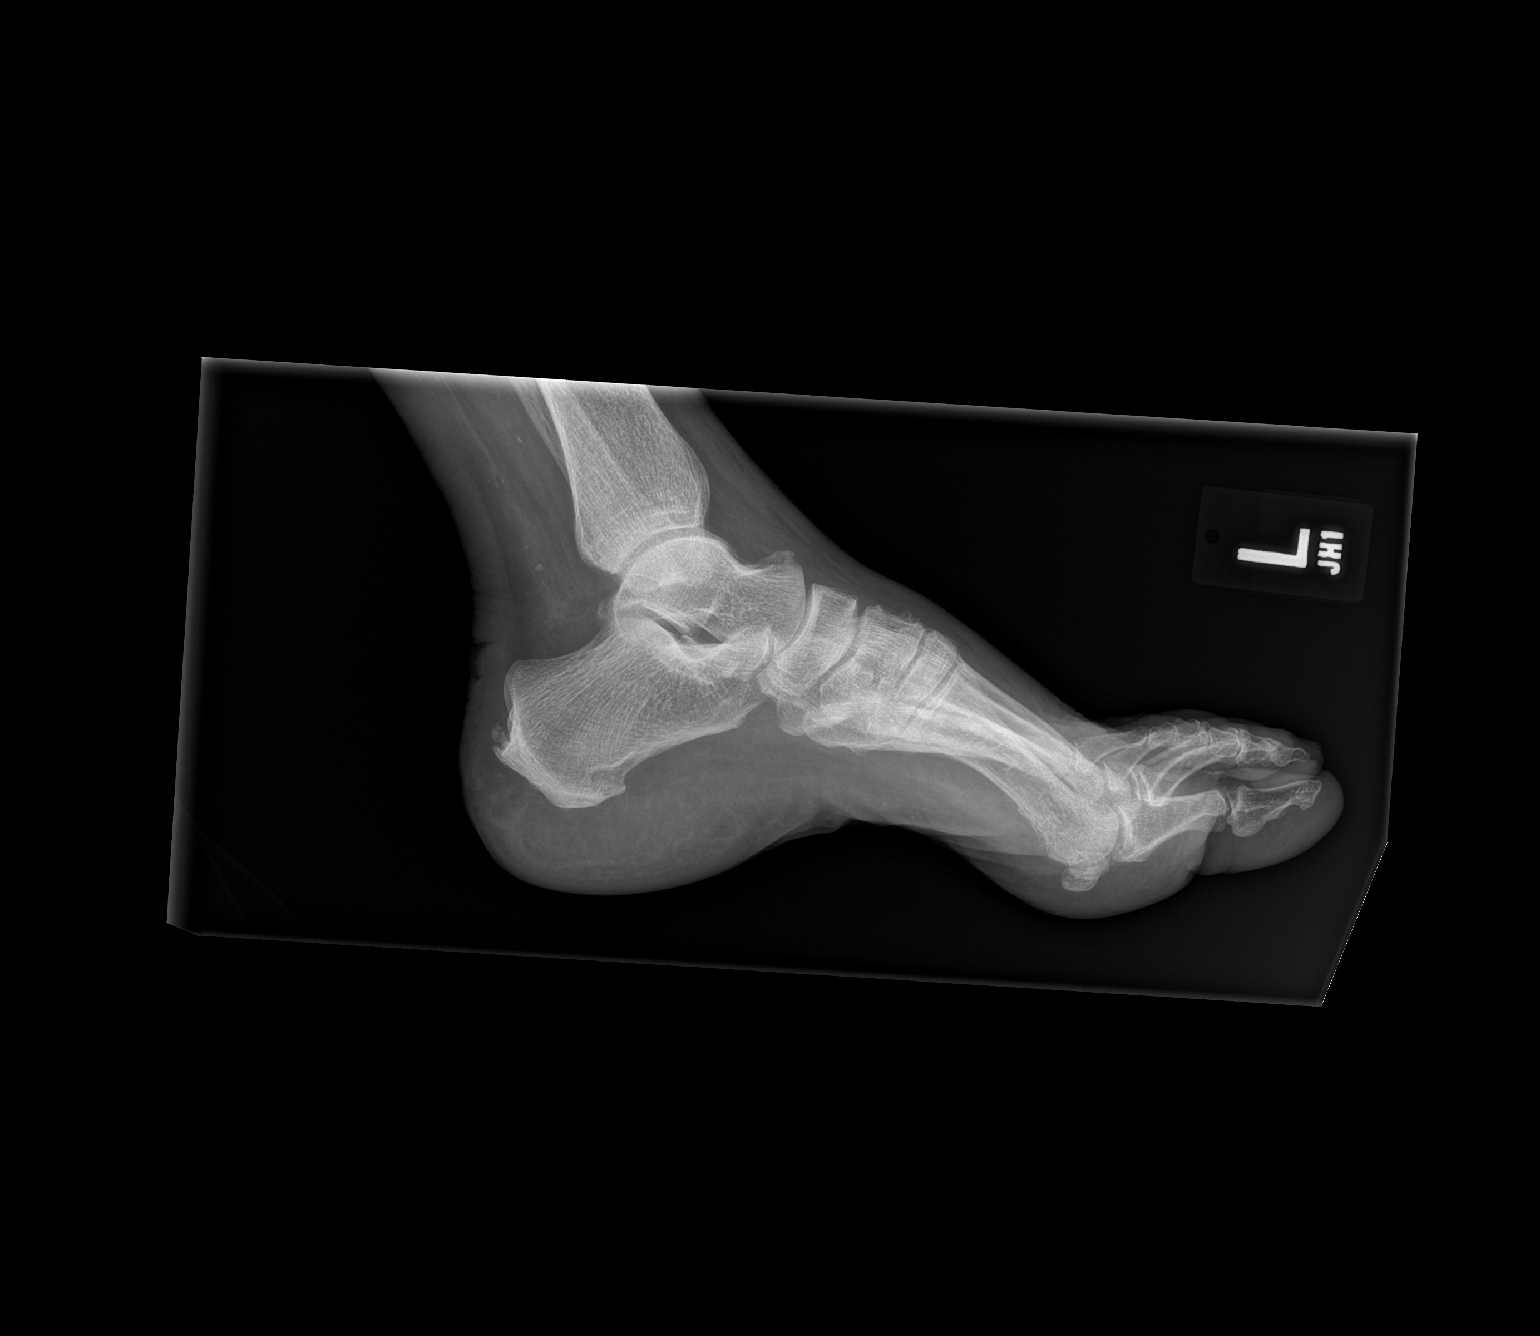

[3 of 3 positions shown; findings below may reference images not displayed]

FINDINGS: Slight splaying of the great and second toes, of unknown chronicity.
No evidence of acute or healing fracture. Mild mid and hindfoot
degenerative change. No evidence of bony destruction or periosteal
reaction. Small plantar calcaneal spur and Achilles tendon
enthesophyte. Skin irregularity posterior to the calcaneus without
tracking soft tissue air or radiopaque foreign body.
IMPRESSION: 1. Skin irregularity posterior to the calcaneus without tracking
soft tissue air or radiopaque foreign body.
2. Slight splaying of the great and second toes, of unknown
chronicity, may be chronic/degenerative.
3. No acute osseous abnormalities or radiographic findings of
osteomyelitis.

## 2021-06-28 IMAGING — DX DG CHEST 1V PORT
1 series · 1 of 1 positions shown · non-contrast
Comparison: None.

CLINICAL DATA: Hypertension

EXAM:
PORTABLE CHEST 1 VIEW

[chest ap]
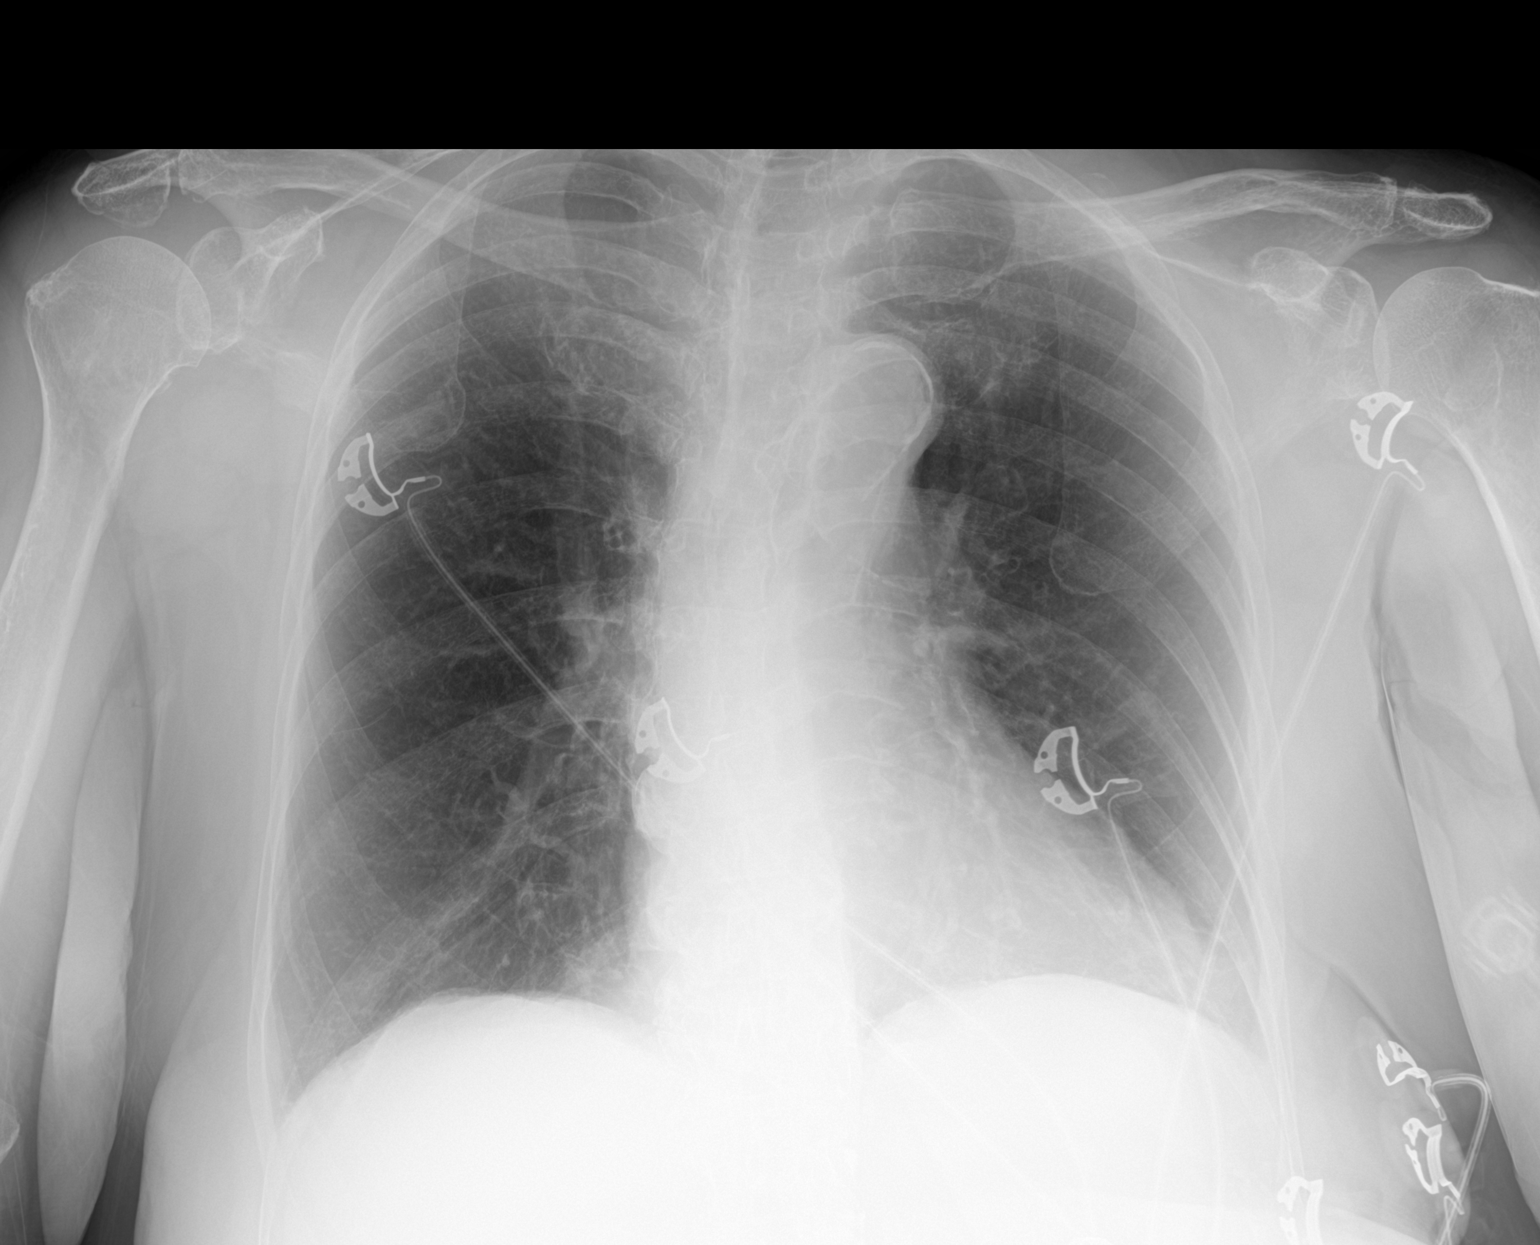

[1 of 1 positions shown; findings below may reference images not displayed]

FINDINGS: Aortic atherosclerosis. Heart is borderline in size. Lungs clear. No
effusions or edema. No acute bony abnormality.
IMPRESSION: No active disease.

Aortic atherosclerosis.
